# Patient Record
Sex: Male | Born: 2005 | Race: Black or African American | Hispanic: No | Marital: Single | Smoking: Never smoker
Health system: Southern US, Community
[De-identification: ages and names within clinical notes are randomized; demographics above are authoritative.]

## PROBLEM LIST (undated history)

## (undated) DIAGNOSIS — J45909 Unspecified asthma, uncomplicated: Secondary | ICD-10-CM

## (undated) HISTORY — PX: VSD REPAIR: SHX276

---
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
## 2024-09-04 ENCOUNTER — Other Ambulatory Visit: Payer: Self-pay

## 2024-09-04 ENCOUNTER — Emergency Department: Admission: EM | Admit: 2024-09-04 | Discharge: 2024-09-04 | Disposition: A | Discharge status: Home / Self Care

## 2024-09-04 ENCOUNTER — Encounter: Payer: Self-pay | Admitting: *Deleted

## 2024-09-04 DIAGNOSIS — L03317 Cellulitis of buttock: Secondary | ICD-10-CM

## 2024-09-04 DIAGNOSIS — L0291 Cutaneous abscess, unspecified: Secondary | ICD-10-CM

## 2024-09-04 MED ORDER — DOXYCYCLINE HYCLATE 100 MG PO TABS
100.0000 mg | ORAL_TABLET | Freq: Once | ORAL | Status: AC
  Administered 2024-09-04: 100 mg via ORAL
  Filled 2024-09-04: qty 1

## 2024-09-04 MED ORDER — SODIUM CHLORIDE 0.9 % IV SOLN
100.0000 mg | Freq: Once | INTRAVENOUS | Status: DC
  Filled 2024-09-04: qty 100

## 2024-09-04 MED ORDER — LIDOCAINE-EPINEPHRINE 1 %-1:100000 IJ SOLN
10.0000 mL | Freq: Once | INTRAMUSCULAR | Status: AC
  Administered 2024-09-04: 10 mL
  Filled 2024-09-04: qty 1

## 2024-09-04 MED ORDER — DOXYCYCLINE HYCLATE 100 MG PO TABS: 100.0000 mg | ORAL_TABLET | Freq: Two times a day (BID) | ORAL | 0 refills | Status: AC

## 2024-09-04 NOTE — ED Provider Notes (Signed)
 [facility] Provider Note    Event Date/Time   First MD Initiated Contact with Patient 09/04/24 2057     (approximate)   History   Abscess   HPI  Benjamin Hatfield is a 18 y.o. male with no significant past medical history who presents to the emergency department with 3 months of right buttock/thigh abscess.  Patient noticed this the issue several months ago and at that time it did self express but it has returned with overlying redness and induration.  He does have an episode of this occurring once previously.  He denies any fevers chills cough congestion chest pain shortness of breath abdominal pain or changes in urinary habits.  He does report his tetanus is up-to-date      Physical Exam   Triage Vital Signs: ED Triage Vitals  Encounter Vitals Group     BP 09/04/24 1919 126/69     Girls Systolic BP Percentile --      Girls Diastolic BP Percentile --      Boys Systolic BP Percentile --      Boys Diastolic BP Percentile --      Pulse Rate 09/04/24 1919 71     Resp 09/04/24 1919 18     Temp 09/04/24 1919 98.1 F (36.7 C)     Temp Source 09/04/24 1919 Oral     SpO2 09/04/24 1919 100 %     Weight 09/04/24 1917 250 lb (113.4 kg)     Height 09/04/24 1917 6' 3 (1.905 m)     Head Circumference --      Peak Flow --      Pain Score 09/04/24 1917 0     Pain Loc --      Pain Education --      Exclude from Growth Chart --     Most recent vital signs: Vitals:   09/04/24 1919  BP: 126/69  Pulse: 71  Resp: 18  Temp: 98.1 F (36.7 C)  SpO2: 100%    Nursing Triage Note reviewed. Vital signs reviewed and patients oxygen saturation is normoxic General: Patient is well nourished, well developed, awake and alert, resting comfortably in no acute distress Head: Normocephalic and atraumatic Eyes: Normal inspection, extraocular muscles intact, no conjunctival pallor Ear, nose, throat: Normal external exam Neck: Normal range of motion Respiratory:  Patient is in no respiratory distress, lungs CTAB Cardiovascular: Patient is not tachycardic, RRR without murmur appreciated GI: Abd SNT with no guarding or rebound  Back: Normal inspection of the back with good strength and range of motion throughout all ext  Patient's right posterior thigh there was a 3 cm area of fluctuance and approximately 5 cm of overlying erythema and induration Extremities: pulses intact with good cap refills, no LE pitting edema or calf tenderness Neuro: The patient is alert and oriented to person, place, and time, appropriately conversive, with 5/5 bilat UE/LE strength, no gross motor or sensory defects noted. Coordination appears to be adequate. Skin: Warm, dry, and intact Psych: normal mood and affect, no SI or HI  ED Results / Procedures / Treatments   Labs (all labs ordered are listed, but only abnormal results are displayed) Labs Reviewed - No data to display   EKG   RADIOLOGY None    PROCEDURES:  Critical Care performed: No  .Incision and Drainage  Date/Time: 09/04/2024 11:30 PM  Performed by: Nicholaus Rolland BRAVO, MD Authorized by: Nicholaus Rolland BRAVO, MD   Consent:    Consent obtained:  Verbal   Consent given by:  Patient   Risks, benefits, and alternatives were discussed: yes     Risks discussed:  Bleeding, incomplete drainage and infection   Alternatives discussed:  Delayed treatment Universal protocol:    Procedure explained and questions answered to patient or proxy's satisfaction: yes     Relevant documents present and verified: yes     Immediately prior to procedure, a time out was called: yes     Patient identity confirmed:  Verbally with patient Location:    Type:  Abscess   Size:  3 cm   Location:  Lower extremity   Lower extremity location:  Leg   Leg location:  R upper leg Pre-procedure details:    Skin preparation:  Povidone-iodine Sedation:    Sedation type:  None Anesthesia:    Anesthesia method:  Local infiltration    Local anesthetic:  Lidocaine  1% WITH epi Procedure type:    Complexity:  Complex (Abscess was probed, washed out, deloculated and packed with quarter packing) Procedure details:    Ultrasound guidance: no     Needle aspiration: no     Incision types:  Elliptical   Incision depth:  Subcutaneous   Wound management:  Probed and deloculated, irrigated with saline and extensive cleaning   Drainage:  Purulent   Drainage amount:  Copious   Wound treatment:  Wound left open   Packing materials:  1/4 in iodoform gauze   Amount 1/4 iodoform:  Approximately 8 cm Post-procedure details:    Procedure completion:  Tolerated well, no immediate complications    MEDICATIONS ORDERED IN ED: Medications  lidocaine -EPINEPHrine  (XYLOCAINE  W/EPI) 1 %-1:100000 (with pres) injection 10 mL (10 mLs Infiltration Given 09/04/24 2112)  doxycycline  (VIBRA -TABS) tablet 100 mg (100 mg Oral Given 09/04/24 2154)     IMPRESSION / MDM / ASSESSMENT AND PLAN / ED COURSE                                Differential diagnosis includes, but is not limited to abscess, cellulitis   ED course: Patient is well-appearing and does have a fluctuant mass on the posterior thigh.  I do think this is consistent with abscess or surrounding cellulitis.  I reviewed the indications benefits risks and alternatives of incision and drainage with the patient and he voiced understanding.  This was incised and drained and packed.  Patient will pull the packing tomorrow morning.  Given that he has had reoccurrence I do think he needs to be treated for possible MRSA and he was sent with a prescription for doxycycline .  He will return with any acutely worsening symptoms or any other emergency     --  COPA: 5 The patient has the following acute or chronic illness/injury that poses a possible threat to life or bodily function: [X] : The patient has a potentially serious acute condition or an acute exacerbation of a chronic illness requiring  urgent evaluation and management in the Emergency Department. The clinical presentation necessitates immediate consideration of life-threatening or function-threatening diagnoses, even if they are ultimately ruled out.   FINAL CLINICAL IMPRESSION(S) / ED DIAGNOSES   Final diagnoses:  Abscess  Cellulitis of buttock     Rx / DC Orders   ED Discharge Orders          Ordered    doxycycline  (VIBRA -TABS) 100 MG tablet  2 times daily        09/04/24 2150  Note:  This document was prepared using Dragon voice recognition software and may include unintentional dictation errors.   Nicholaus Rolland BRAVO, MD 09/04/24 [phone]

## 2024-09-04 NOTE — Discharge Instructions (Signed)
 You were seen in the emergency department for right thigh/buttock abscess.  This was drained and packed.  Tomorrow morning pulled the packing.  You might notice some drainage for the next 72 hours this is expected.  Please take your antibiotics as prescribed.  And return with any acutely worsening symptoms. == RETURN PRECAUTIONS & AFTERCARE: (ENGLISH) RETURN PRECAUTIONS: Return immediately to the emergency department or see/call your doctor if you feel worse, weak or have changes in speech or vision, are short of breath, have fever, vomiting, pain, bleeding or dark stool, trouble urinating or any new issues. Return here or see/call your doctor if not improving as expected for your suspected condition. FOLLOW-UP CARE: Call your doctor and/or any doctors we referred you to for more advice and to make an appointment. Do this today, tomorrow or after the weekend. Some doctors only take PPO insurance so if you have HMO insurance you may want to contact your HMO or your regular doctor for referral to a specialist within your plan. Either way tell the doctor's office that it was a referral from the emergency department so you get the soonest possible appointment.  YOUR TEST RESULTS: Take result reports of any blood or urine tests, imaging tests and EKG's to your doctor and any referral doctor. Have any abnormal tests repeated. Your doctor or a referral doctor can let you know when this should be done. Also make sure your doctor contacts this hospital to get any test results that are not currently available such as cultures or special tests for infection and final imaging reports, which are often not available at the time you leave the ER but which may list additional important findings that are not documented on the preliminary report. BLOOD PRESSURE: If your blood pressure was greater than 120/80 have your blood pressure rechecked within 1 to 2 weeks. MEDICATION SIDE EFFECTS: Do not drive, walk, bike, take the bus,  etc. if you have received or are being prescribed any sedating medications such as those for pain or anxiety or certain antihistamines like Benadryl . If you have been give one of these here get a taxi home or have a friend drive you home. Ask your pharmacist to counsel you on potential side effects of any new medication

## 2024-09-04 NOTE — ED Triage Notes (Signed)
 Pt ambulatory to triage.  Pt has abscess to right upper leg x 3 months.  Pt reports pain since last week.  No drainage noted.  Pt alert.
